# Patient Record
Sex: Female | Born: 1975 | Hispanic: Yes | Marital: Married | Smoking: Never smoker
Health system: Southern US, Community
[De-identification: ages and names within clinical notes are randomized; demographics above are authoritative.]

## PROBLEM LIST (undated history)

## (undated) DIAGNOSIS — M009 Pyogenic arthritis, unspecified: Secondary | ICD-10-CM

## (undated) DIAGNOSIS — M869 Osteomyelitis, unspecified: Secondary | ICD-10-CM

## (undated) DIAGNOSIS — O24419 Gestational diabetes mellitus in pregnancy, unspecified control: Secondary | ICD-10-CM

## (undated) HISTORY — DX: Osteomyelitis, unspecified: M86.9

## (undated) HISTORY — DX: Gestational diabetes mellitus in pregnancy, unspecified control: O24.419

## (undated) HISTORY — DX: Pyogenic arthritis, unspecified: M00.9

---
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
## 2012-05-10 DIAGNOSIS — M869 Osteomyelitis, unspecified: Secondary | ICD-10-CM

## 2012-05-10 HISTORY — DX: Osteomyelitis, unspecified: M86.9

## 2012-05-10 HISTORY — PX: OTHER SURGICAL HISTORY: SHX169

## 2013-05-10 DIAGNOSIS — O24419 Gestational diabetes mellitus in pregnancy, unspecified control: Secondary | ICD-10-CM

## 2013-05-10 HISTORY — PX: TUBAL LIGATION: SHX77

## 2013-05-10 HISTORY — DX: Gestational diabetes mellitus in pregnancy, unspecified control: O24.419

## 2016-05-20 ENCOUNTER — Ambulatory Visit (INDEPENDENT_AMBULATORY_CARE_PROVIDER_SITE_OTHER): Payer: Self-pay | Admitting: Internal Medicine

## 2016-05-20 ENCOUNTER — Encounter: Payer: Self-pay | Admitting: Internal Medicine

## 2016-05-20 VITALS — BP 112/80 | HR 70 | Resp 12 | Ht 59.5 in | Wt 141.0 lb

## 2016-05-20 DIAGNOSIS — F341 Dysthymic disorder: Secondary | ICD-10-CM

## 2016-05-20 DIAGNOSIS — R1033 Periumbilical pain: Secondary | ICD-10-CM

## 2016-05-20 DIAGNOSIS — G44219 Episodic tension-type headache, not intractable: Secondary | ICD-10-CM

## 2016-05-20 DIAGNOSIS — M542 Cervicalgia: Secondary | ICD-10-CM

## 2016-05-20 NOTE — Progress Notes (Signed)
   Subjective:    Patient ID: Angela Bennett, female    DOB: 08-21-1975, 41 y.o.   MRN: [id number]  HPI   Here to establish  1.  Headaches:  Has had headaches for 2 years. Has headaches 2-3 times per week.   Frontal headache above brow line.   Describes pounding headache.  Lasts 1-2 hours and does not take anything for the pain.   No aura. Describes mild photophobia and nausea, but no vomiting with the headaches. Has taken 200 mg Ibuprofen when the headache is bad, and helps a bit. No history of injury to head. Does feel like she is under a lot of stress in past 2 years--mainly related to [place]  Her sister died in [place] and left 3 children behind. Her mother, who is not well, is caring for them.  2.  Mid abdominal pain:  When has pressure over mid abdomen from clothes or something pushing against her, she has discomfort and makes it hard to breathe.  Does not wear tight clothing because of this. Has had this for many years. Has not had a physical in 3 years.  This pain predates that.  No outpatient prescriptions have been marked as taking for the 05/20/16 encounter (Office Visit) with Julieanne MansonElizabeth Corwyn Vora, MD.    No Known Allergies   Past Medical History:  Diagnosis Date  . Osteomyelitis of femur (HCC) 2014   [place]debrided   Past Surgical History:  Procedure Laterality Date  . debridment of femur Left 20014   [place]   Social History   Social History  . Marital status: Married    Spouse name: N/A  . Number of children: 4  . Years of education: N/A   Occupational History  . housewife currently    Social History Main Topics  . Smoking status: Never Smoker  . Smokeless tobacco: Never Used  . Alcohol use No  . Drug use: No  . Sexual activity: Not on file   Other Topics Concern  . Not on file   Social History Narrative   Originally from [place]   [place]   Lives at home with husband and 4 children   History reviewed. No pertinent family  history.    Review of Systems     Objective:   Physical Exam  NAD HEENT: PERRL, EOMI, discs sharp, TMs pearly gray, throat without injection. Neck:  Supple, No adenopathy or thyromegaly, tender over bilateral traps to paraspinous musculature to nuchal ridge. Chest:  CTA CV: RRR with normal S1 and S2, No S3, S4 or murmur, radial and DP pulses normal and equal Abd:  S, + BS, tender about mid abdomen, but no mass, rebound or peritoneal signs.  No HSM Neuro:  A & O x 3, CN II-XII grossly intact, DTRs 2+/4, Motor 5/5, sensory grossly normal. Gait and coordination normal        Assessment & Plan:  1.  Stress:  Contacted SW to notify feel patient with stressful issues that could benefit from counseling.  Has been depressed now she has more time on her hands to think.   2.  Headaches:  Suspect initiate as tension headaches and may become migranous.  To try ibuprofen 400-800 mg every 8 hours with food as needed for headache. PT to work on neck and upper back muscle pain and spasm.  3.  Abdominal Pain:  No findings:  Check CBC, CMP

## 2016-05-21 LAB — CBC WITH DIFFERENTIAL/PLATELET
BASOS ABS: 0 10*3/uL (ref 0.0–0.2)
Basos: 0 %
EOS (ABSOLUTE): 0.1 10*3/uL (ref 0.0–0.4)
Eos: 2 %
HEMATOCRIT: 37.9 % (ref 34.0–46.6)
HEMOGLOBIN: 13.2 g/dL (ref 11.1–15.9)
Immature Grans (Abs): 0 10*3/uL (ref 0.0–0.1)
Immature Granulocytes: 0 %
LYMPHS ABS: 2.4 10*3/uL (ref 0.7–3.1)
Lymphs: 37 %
MCH: 29.5 pg (ref 26.6–33.0)
MCHC: 34.8 g/dL (ref 31.5–35.7)
MCV: 85 fL (ref 79–97)
MONOCYTES: 8 %
MONOS ABS: 0.5 10*3/uL (ref 0.1–0.9)
NEUTROS ABS: 3.4 10*3/uL (ref 1.4–7.0)
Neutrophils: 53 %
Platelets: 210 10*3/uL (ref 150–379)
RBC: 4.47 x10E6/uL (ref 3.77–5.28)
RDW: 13.8 % (ref 12.3–15.4)
WBC: 6.5 10*3/uL (ref 3.4–10.8)

## 2016-05-21 LAB — COMPREHENSIVE METABOLIC PANEL
ALBUMIN: 4.2 g/dL (ref 3.5–5.5)
ALK PHOS: 60 IU/L (ref 39–117)
ALT: 12 IU/L (ref 0–32)
AST: 17 IU/L (ref 0–40)
Albumin/Globulin Ratio: 1.3 (ref 1.2–2.2)
BILIRUBIN TOTAL: 0.3 mg/dL (ref 0.0–1.2)
BUN / CREAT RATIO: 11 (ref 9–23)
BUN: 9 mg/dL (ref 6–20)
CHLORIDE: 100 mmol/L (ref 96–106)
CO2: 25 mmol/L (ref 18–29)
Calcium: 9.5 mg/dL (ref 8.7–10.2)
Creatinine, Ser: 0.82 mg/dL (ref 0.57–1.00)
GFR calc Af Amer: 104 mL/min/{1.73_m2} (ref >59)
GFR calc non Af Amer: 90 mL/min/{1.73_m2} (ref >59)
GLOBULIN, TOTAL: 3.2 g/dL (ref 1.5–4.5)
GLUCOSE: 88 mg/dL (ref 65–99)
Potassium: 4.5 mmol/L (ref 3.5–5.2)
SODIUM: 140 mmol/L (ref 134–144)
Total Protein: 7.4 g/dL (ref 6.0–8.5)

## 2016-06-02 ENCOUNTER — Telehealth: Payer: Self-pay | Admitting: Licensed Clinical Social Worker

## 2016-06-02 NOTE — Telephone Encounter (Signed)
Intern left message in order to schedule counseling.

## 2016-06-09 ENCOUNTER — Telehealth: Payer: Self-pay | Admitting: Licensed Clinical Social Worker

## 2016-06-09 NOTE — Telephone Encounter (Signed)
SWI left message for Angela Bennett in order to schedule a counseling session.

## 2016-06-11 ENCOUNTER — Telehealth: Payer: Self-pay | Admitting: Internal Medicine

## 2016-06-17 NOTE — Progress Notes (Signed)
Patient was notified of lab results and was inquiring about whether she needs have a pap done.

## 2016-06-17 NOTE — Telephone Encounter (Signed)
Patient says it has been three years since her last pap and would like to know if it is ok to make appointment for one.

## 2016-06-17 NOTE — Telephone Encounter (Signed)
Please schedule CPE with pap

## 2016-06-25 NOTE — Telephone Encounter (Signed)
Patient was scheduled for a CPE on 08/26/16 at 11:30 am.

## 2016-06-28 NOTE — Telephone Encounter (Signed)
noted 

## 2016-08-19 ENCOUNTER — Ambulatory Visit: Payer: Self-pay | Admitting: Internal Medicine

## 2016-08-26 ENCOUNTER — Ambulatory Visit (INDEPENDENT_AMBULATORY_CARE_PROVIDER_SITE_OTHER): Payer: Self-pay | Admitting: Internal Medicine

## 2016-08-26 VITALS — BP 110/70 | HR 72 | Resp 12 | Ht 60.0 in | Wt 138.0 lb

## 2016-08-26 DIAGNOSIS — Z0000 Encounter for general adult medical examination without abnormal findings: Secondary | ICD-10-CM

## 2016-08-26 DIAGNOSIS — Z124 Encounter for screening for malignant neoplasm of cervix: Secondary | ICD-10-CM

## 2016-08-26 DIAGNOSIS — K029 Dental caries, unspecified: Secondary | ICD-10-CM

## 2016-09-06 ENCOUNTER — Other Ambulatory Visit (INDEPENDENT_AMBULATORY_CARE_PROVIDER_SITE_OTHER): Payer: Self-pay | Admitting: Internal Medicine

## 2016-09-06 DIAGNOSIS — Z13220 Encounter for screening for lipoid disorders: Secondary | ICD-10-CM

## 2016-09-06 DIAGNOSIS — E010 Iodine-deficiency related diffuse (endemic) goiter: Secondary | ICD-10-CM
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
## 2016-09-07 LAB — LIPID PANEL W/O CHOL/HDL RATIO
Cholesterol, Total: 201 mg/dL — ABNORMAL HIGH (ref 100–199)
HDL: 41 mg/dL (ref >39)
LDL CALC: 126 mg/dL — AB (ref 0–99)
Triglycerides: 170 mg/dL — ABNORMAL HIGH (ref 0–149)
VLDL CHOLESTEROL CAL: 34 mg/dL (ref 5–40)

## 2016-09-07 LAB — TSH: TSH: 2.14 u[IU]/mL (ref 0.450–4.500)

## 2016-10-16 ENCOUNTER — Encounter: Payer: Self-pay | Admitting: Internal Medicine

## 2016-10-16 DIAGNOSIS — M009 Pyogenic arthritis, unspecified: Secondary | ICD-10-CM | POA: Insufficient documentation
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
## 2016-10-16 NOTE — Addendum Note (Signed)
Addended by: Marcene DuosMULBERRY, Jezebelle Ledwell M on: 10/16/2016 11:22 PM   Modules accepted: Orders

## 2017-01-31 ENCOUNTER — Other Ambulatory Visit (INDEPENDENT_AMBULATORY_CARE_PROVIDER_SITE_OTHER): Payer: Self-pay

## 2017-01-31 DIAGNOSIS — Z13220 Encounter for screening for lipoid disorders: Secondary | ICD-10-CM

## 2017-02-01 LAB — LIPID PANEL W/O CHOL/HDL RATIO
CHOLESTEROL TOTAL: 218 mg/dL — AB (ref 100–199)
HDL: 41 mg/dL (ref >39)
LDL CALC: 138 mg/dL — AB (ref 0–99)
TRIGLYCERIDES: 194 mg/dL — AB (ref 0–149)
VLDL CHOLESTEROL CAL: 39 mg/dL (ref 5–40)

## 2017-03-15 ENCOUNTER — Other Ambulatory Visit: Payer: Self-pay | Admitting: Licensed Clinical Social Worker

## 2017-08-25 ENCOUNTER — Other Ambulatory Visit: Payer: Self-pay

## 2017-08-25 DIAGNOSIS — E785 Hyperlipidemia, unspecified: Secondary | ICD-10-CM

## 2017-08-26 LAB — LIPID PANEL W/O CHOL/HDL RATIO
CHOLESTEROL TOTAL: 228 mg/dL — AB (ref 100–199)
HDL: 51 mg/dL (ref >39)
LDL Calculated: 148 mg/dL — ABNORMAL HIGH (ref 0–99)
TRIGLYCERIDES: 144 mg/dL (ref 0–149)
VLDL CHOLESTEROL CAL: 29 mg/dL (ref 5–40)

## 2017-10-05 ENCOUNTER — Encounter: Payer: Self-pay | Admitting: Internal Medicine

## 2017-10-05 ENCOUNTER — Ambulatory Visit: Payer: Self-pay | Admitting: Internal Medicine

## 2017-10-05 VITALS — BP 112/80 | HR 72 | Resp 12 | Ht 60.0 in | Wt 136.0 lb

## 2017-10-05 DIAGNOSIS — E782 Mixed hyperlipidemia: Secondary | ICD-10-CM

## 2017-10-05 NOTE — Patient Instructions (Signed)

## 2017-10-05 NOTE — Progress Notes (Signed)
   Subjective:    Patient ID: Angela Bennett, female    DOB: 11/11/1975, 42 y.o.   MRN: [id number]  HPI   Hyperlipidemia:  Has not been seen since April of 2018 when found to have hyperlipidemia with CPE. Has come in for fasting labs, however. Mildly overweight. Discussed concern regarding her total and LDL in particular are continuing to rise.  Lipid Panel     Component Value Date/Time   CHOL 228 (H) 08/25/2017 0841   TRIG 144 08/25/2017 0841   HDL 51 08/25/2017 0841   LDLCALC 148 (H) 08/25/2017 0841    Current Meds  Medication Sig  . Multiple Vitamin (MULTIVITAMIN) tablet Take 1 tablet by mouth daily.   No Known Allergies   Review of Systems     Objective:   Physical Exam NAD Lungs:  CTA CV:  RRR without murmur or rub.  Radial and DP pulses normal and equal Abd:  S, NT, No HSM or mass.. + Bs       Assessment & Plan:  1.  Hypercholesterolemia:  Discussed diet and physical activity at length.  Dietary recommendations given and to work on daily physical activity. Repeat fasting labs in 4 months with OV a couple of days later to discuss.

## 2018-01-15 DIAGNOSIS — E782 Mixed hyperlipidemia: Secondary | ICD-10-CM

## 2018-01-15 HISTORY — DX: Mixed hyperlipidemia: E78.2

## 2018-01-31 ENCOUNTER — Other Ambulatory Visit: Payer: Self-pay

## 2018-01-31 DIAGNOSIS — E782 Mixed hyperlipidemia: Secondary | ICD-10-CM

## 2018-01-31 DIAGNOSIS — Z0000 Encounter for general adult medical examination without abnormal findings: Secondary | ICD-10-CM

## 2018-02-01 LAB — COMPREHENSIVE METABOLIC PANEL
ALT: 9 IU/L (ref 0–32)
AST: 13 IU/L (ref 0–40)
Albumin/Globulin Ratio: 1.7 (ref 1.2–2.2)
Albumin: 4.4 g/dL (ref 3.5–5.5)
Alkaline Phosphatase: 59 IU/L (ref 39–117)
BILIRUBIN TOTAL: 0.3 mg/dL (ref 0.0–1.2)
BUN/Creatinine Ratio: 12 (ref 9–23)
BUN: 10 mg/dL (ref 6–24)
CALCIUM: 9 mg/dL (ref 8.7–10.2)
CHLORIDE: 105 mmol/L (ref 96–106)
CO2: 21 mmol/L (ref 20–29)
Creatinine, Ser: 0.82 mg/dL (ref 0.57–1.00)
GFR, EST AFRICAN AMERICAN: 103 mL/min/{1.73_m2} (ref >59)
GFR, EST NON AFRICAN AMERICAN: 89 mL/min/{1.73_m2} (ref >59)
GLUCOSE: 102 mg/dL — AB (ref 65–99)
Globulin, Total: 2.6 g/dL (ref 1.5–4.5)
POTASSIUM: 5.1 mmol/L (ref 3.5–5.2)
Sodium: 140 mmol/L (ref 134–144)
TOTAL PROTEIN: 7 g/dL (ref 6.0–8.5)

## 2018-02-01 LAB — CBC WITH DIFFERENTIAL/PLATELET
Basophils Absolute: 0 10*3/uL (ref 0.0–0.2)
Basos: 0 %
EOS (ABSOLUTE): 0.1 10*3/uL (ref 0.0–0.4)
Eos: 1 %
Hematocrit: 38.7 % (ref 34.0–46.6)
Hemoglobin: 13.1 g/dL (ref 11.1–15.9)
IMMATURE GRANS (ABS): 0 10*3/uL (ref 0.0–0.1)
IMMATURE GRANULOCYTES: 0 %
LYMPHS: 34 %
Lymphocytes Absolute: 1.7 10*3/uL (ref 0.7–3.1)
MCH: 29.9 pg (ref 26.6–33.0)
MCHC: 33.9 g/dL (ref 31.5–35.7)
MCV: 88 fL (ref 79–97)
MONOS ABS: 0.3 10*3/uL (ref 0.1–0.9)
Monocytes: 6 %
NEUTROS PCT: 59 %
Neutrophils Absolute: 2.8 10*3/uL (ref 1.4–7.0)
PLATELETS: 230 10*3/uL (ref 150–450)
RBC: 4.38 x10E6/uL (ref 3.77–5.28)
RDW: 13.8 % (ref 12.3–15.4)
WBC: 4.9 10*3/uL (ref 3.4–10.8)

## 2018-02-01 LAB — LIPID PANEL W/O CHOL/HDL RATIO
Cholesterol, Total: 199 mg/dL (ref 100–199)
HDL: 38 mg/dL — ABNORMAL LOW (ref >39)
LDL CALC: 135 mg/dL — AB (ref 0–99)
Triglycerides: 132 mg/dL (ref 0–149)
VLDL CHOLESTEROL CAL: 26 mg/dL (ref 5–40)

## 2018-02-03 ENCOUNTER — Encounter: Payer: Self-pay | Admitting: Internal Medicine

## 2018-02-23 ENCOUNTER — Ambulatory Visit: Payer: Self-pay | Admitting: Internal Medicine

## 2018-02-23 ENCOUNTER — Encounter: Payer: Self-pay | Admitting: Internal Medicine

## 2018-02-23 VITALS — BP 110/80 | HR 68 | Resp 12 | Ht 60.0 in | Wt 137.0 lb

## 2018-02-23 DIAGNOSIS — E663 Overweight: Secondary | ICD-10-CM

## 2018-02-23 DIAGNOSIS — M549 Dorsalgia, unspecified: Secondary | ICD-10-CM

## 2018-02-23 DIAGNOSIS — G8929 Other chronic pain: Secondary | ICD-10-CM

## 2018-02-23 DIAGNOSIS — R7301 Impaired fasting glucose: Secondary | ICD-10-CM

## 2018-02-23 DIAGNOSIS — M542 Cervicalgia: Secondary | ICD-10-CM

## 2018-02-23 DIAGNOSIS — E782 Mixed hyperlipidemia: Secondary | ICD-10-CM

## 2018-02-23 DIAGNOSIS — M5416 Radiculopathy, lumbar region: Secondary | ICD-10-CM

## 2018-02-23 MED ORDER — IBUPROFEN 200 MG PO TABS: ORAL_TABLET | ORAL | 0 refills | Status: DC

## 2018-02-23 NOTE — Patient Instructions (Addendum)
Free flu vaccine/covered flu vaccine (bring insurance card if you have one) at flu vaccine clinics:   [place] card sign up in October 17th for 2 hours  between 8:30 a.m. and 11:30 a.m .  [facility]:  [phone]  Oswaldo Done un vaso de agua antes de cada comida Tome un minimo de 6 a 8 vasos de agua diarios Coma tres veces al dia Coma Neomia Dear proteina y Neomia Dear grasa saludable con comida.  (huevos, pescado, pollo, pavo, y limite carnes rojas Coma 5 porciones diarias de legumbres.  Mezcle los colores Coma 2 porciones diarias de frutas con cascara cuando sea comestible Use platos pequeos Suelte su tenedor o cuchara despues de cada mordida hata que se mastique y se trague Come en la mesa con amigos o familiares por lo menos una vez al dia Apague la televisin y aparatos electrnicos durante la comida  Su objetivo debe ser perder una libra por semana  Estudios recientes indican que las personas quienes consumen todos de sus calorias durante 12 horas se bajan de pesocon Mas eficiencia.  Por ejemplo, si Usted come su primera comida a las 7:00 a.m., su comida final del dia se debe completar antes de las 7:00 p.m.

## 2018-02-23 NOTE — Progress Notes (Signed)
   Subjective:    Patient ID: Angela Bennett, female    DOB: [birth year], 42 y.o.   MRN: [id number]  HPI  visit in May with elevated cholesterol, she has been running a bit and abdominal exercises, but nothing for the last month. She has been drinking cactus containing drinks. Dropped off with caring for herself a month ago as her mother is in renal failure from DM and requiring money for dialysis.  Patient has been working more to provide the money for that care.   2.  Mild hyperglycemia:  History of gestational DM and mother with DM.    3.  Mildly overweight:  BMI just over 26.  As above.  4.  Entire bilateral back involved with pain, particularly low back. When has to stand a lot is when she has most of her pain.  When her 42 yo walks on her back, feels better. She can have mild sense of numbness and tingling in left buttock and lateral left thigh. Has taken Advil 2 tabs when has pain--takes once in a day and helps with the pain.  No outpatient medications have been marked as taking for the 02/23/18 encounter (Office Visit) with Julieanne Manson, MD.   No Known Allergies  Review of Systems     Objective:   Physical Exam NAD Neck/ MS:  Tender over trap as cervical, lumbosacral paraspinous musculature bilaterally.  Tender mildly over lumbar spinous processes. Negative straight leg raise. Chest:  CTA CV:  RRR without murmur or rub. LE:  No edema Neuro:  DTRs 2+/4 and Motor 5/5 throughout.  Sensory to light touch normal.  Gait normal       Assessment & Plan:  1.  Hyperlipidemia:  Concerned she is not performing self care.  Expressed need to get started on lifestyle changes for life to prevent illness as her mother has developed with DM.   Discussed her cholesterol profile and mild hyperglycemia and gestational DM history all concerns me for development of DM in herself if no changes made long term. Goal to work on diet and physical activity to lose 1/2 lb per week.  2.   Fasting hyperglycemia:  Mild.  As above.  3.  Mildly overweight:  As above.  4.  Back and neck pain with left lumbar radiculopathy:  PT referral  Advil 2 tabs twice daily with food for 2 weeks, then only as needed.

## 2018-04-07 ENCOUNTER — Encounter (HOSPITAL_COMMUNITY): Payer: Self-pay | Admitting: Emergency Medicine

## 2018-04-07 ENCOUNTER — Other Ambulatory Visit: Payer: Self-pay

## 2018-04-07 ENCOUNTER — Emergency Department (HOSPITAL_COMMUNITY)
Admission: EM | Admit: 2018-04-07 | Discharge: 2018-04-07 | Disposition: A | Discharge status: Home / Self Care | Payer: No Typology Code available for payment source | Attending: Emergency Medicine | Admitting: Emergency Medicine

## 2018-04-07 ENCOUNTER — Emergency Department (HOSPITAL_COMMUNITY): Payer: No Typology Code available for payment source

## 2018-04-07 DIAGNOSIS — Y939 Activity, unspecified: Secondary | ICD-10-CM | POA: Insufficient documentation

## 2018-04-07 DIAGNOSIS — M545 Low back pain: Secondary | ICD-10-CM | POA: Insufficient documentation

## 2018-04-07 DIAGNOSIS — Y929 Unspecified place or not applicable: Secondary | ICD-10-CM | POA: Diagnosis not present

## 2018-04-07 DIAGNOSIS — M542 Cervicalgia: Secondary | ICD-10-CM | POA: Diagnosis not present

## 2018-04-07 DIAGNOSIS — Y999 Unspecified external cause status: Secondary | ICD-10-CM | POA: Insufficient documentation

## 2018-04-07 DIAGNOSIS — M79641 Pain in right hand: Secondary | ICD-10-CM | POA: Diagnosis not present

## 2018-04-07 MED ORDER — METHOCARBAMOL 500 MG PO TABS: 500.0000 mg | ORAL_TABLET | Freq: Two times a day (BID) | ORAL | 0 refills | Status: DC
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
## 2018-04-07 NOTE — ED Notes (Signed)
Pt transported to XR.  

## 2018-04-07 NOTE — Discharge Instructions (Addendum)
Please read attached information. If you experience any new or worsening signs or symptoms please return to the emergency room for evaluation. Please follow-up with your primary care provider or specialist as discussed. Please use medication prescribed only as directed and discontinue taking if you have any concerning signs or symptoms.   °
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
## 2018-04-07 NOTE — ED Notes (Signed)
Patient verbalizes understanding of discharge instructions. Opportunity for questioning and answers were provided. Armband removed by staff, pt discharged from ED. Instructions given for thumb spica application. Pt ambulatory to lobby with family.

## 2018-06-01 ENCOUNTER — Ambulatory Visit: Payer: Self-pay | Admitting: Internal Medicine

## 2020-11-18 ENCOUNTER — Emergency Department (HOSPITAL_COMMUNITY)
Admission: EM | Admit: 2020-11-18 | Discharge: 2020-11-19 | Disposition: A | Discharge status: Home / Self Care | Payer: Self-pay | Attending: Emergency Medicine | Admitting: Emergency Medicine

## 2020-11-18 ENCOUNTER — Other Ambulatory Visit: Payer: Self-pay

## 2020-11-18 DIAGNOSIS — R1030 Lower abdominal pain, unspecified: Secondary | ICD-10-CM | POA: Insufficient documentation

## 2020-11-18 DIAGNOSIS — R109 Unspecified abdominal pain: Secondary | ICD-10-CM

## 2020-11-18 DIAGNOSIS — E871 Hypo-osmolality and hyponatremia: Secondary | ICD-10-CM | POA: Insufficient documentation

## 2020-11-18 DIAGNOSIS — Z20822 Contact with and (suspected) exposure to covid-19: Secondary | ICD-10-CM | POA: Insufficient documentation

## 2020-11-18 DIAGNOSIS — R197 Diarrhea, unspecified: Secondary | ICD-10-CM | POA: Insufficient documentation

## 2020-11-18 DIAGNOSIS — R7401 Elevation of levels of liver transaminase levels: Secondary | ICD-10-CM

## 2020-11-18 NOTE — ED Triage Notes (Signed)
Patient with two to three day history of abdominal pain and bloating.  She states that she felt better on Monday and then today it got worse.  She states she has nausea, no vomiting or diarrhea.  She states that she has had normal BMs with no urinary problems.

## 2020-11-19 ENCOUNTER — Emergency Department (HOSPITAL_COMMUNITY): Payer: Self-pay

## 2020-11-19 ENCOUNTER — Encounter (HOSPITAL_COMMUNITY): Payer: Self-pay | Admitting: Emergency Medicine

## 2020-11-19 LAB — COMPREHENSIVE METABOLIC PANEL
ALT: 65 U/L — ABNORMAL HIGH (ref 0–44)
AST: 49 U/L — ABNORMAL HIGH (ref 15–41)
Albumin: 3.8 g/dL (ref 3.5–5.0)
Alkaline Phosphatase: 45 U/L (ref 38–126)
Anion gap: 7 (ref 5–15)
BUN: 8 mg/dL (ref 6–20)
CO2: 22 mmol/L (ref 22–32)
Calcium: 8.3 mg/dL — ABNORMAL LOW (ref 8.9–10.3)
Chloride: 104 mmol/L (ref 98–111)
Creatinine, Ser: 0.63 mg/dL (ref 0.44–1.00)
GFR, Estimated: >60 mL/min (ref >60)
Glucose, Bld: 99 mg/dL (ref 70–99)
Potassium: 3.7 mmol/L (ref 3.5–5.1)
Sodium: 133 mmol/L — ABNORMAL LOW (ref 135–145)
Total Bilirubin: 0.6 mg/dL (ref 0.3–1.2)
Total Protein: 6.6 g/dL (ref 6.5–8.1)

## 2020-11-19 LAB — I-STAT BETA HCG BLOOD, ED (MC, WL, AP ONLY): I-stat hCG, quantitative: <5 m[IU]/mL (ref <5)

## 2020-11-19 LAB — RESP PANEL BY RT-PCR (FLU A&B, COVID) ARPGX2
Influenza A by PCR: NEGATIVE
Influenza B by PCR: NEGATIVE
SARS Coronavirus 2 by RT PCR: NEGATIVE

## 2020-11-19 LAB — CBC
HCT: 37.2 % (ref 36.0–46.0)
Hemoglobin: 12.7 g/dL (ref 12.0–15.0)
MCH: 30.2 pg (ref 26.0–34.0)
MCHC: 34.1 g/dL (ref 30.0–36.0)
MCV: 88.4 fL (ref 80.0–100.0)
Platelets: 205 10*3/uL (ref 150–400)
RBC: 4.21 MIL/uL (ref 3.87–5.11)
RDW: 12.8 % (ref 11.5–15.5)
WBC: 4.4 10*3/uL (ref 4.0–10.5)
nRBC: 0 % (ref 0.0–0.2)

## 2020-11-19 LAB — URINALYSIS, ROUTINE W REFLEX MICROSCOPIC
Bacteria, UA: NONE SEEN
Bilirubin Urine: NEGATIVE
Glucose, UA: NEGATIVE mg/dL
Ketones, ur: NEGATIVE mg/dL
Nitrite: NEGATIVE
Protein, ur: NEGATIVE mg/dL
Specific Gravity, Urine: 1.013 (ref 1.005–1.030)
pH: 6 (ref 5.0–8.0)

## 2020-11-19 LAB — LIPASE, BLOOD: Lipase: 29 U/L (ref 11–51)

## 2020-11-19 NOTE — ED Provider Notes (Signed)
[facility] EMERGENCY DEPARTMENT Provider Note   CSN: [id number] Arrival date & time: 11/18/20  2115     History Chief Complaint  Patient presents with   Abdominal Pain    Angela Bennett is a 45 y.o. female.  The history is provided by the patient. A language interpreter was used.  Abdominal Pain She has history of gestational diabetes, hyperlipidemia and comes in because of abdominal pain which started yesterday.  Pain is across the lower abdomen without radiation.  She denies nausea, vomiting, diarrhea.  However, she did have nausea, vomiting, diarrhea starting 3 days ago and ending 2 days ago.  She denies fever, chills, sweats.  Nothing makes her pain better, nothing makes it worse.  She states that it has actually improved since she has been in the emergency department.  She has not had pain like this before.  She treated herself with aspirin which did give some temporary relief.  She denies urinary urgency, frequency, tenesmus, dysuria.   Past Medical History:  Diagnosis Date   Gestational diabetes 2015   Osteomyelitis of femur (HCC) 2014   [place]--debrided   Septic arthritis of knee, left (HCC)    [place]    Patient Active Problem List   Diagnosis Date Noted   Mixed hyperlipidemia 01/15/2018   Septic arthritis of knee, left (HCC)    Gestational diabetes 05/10/2013    Past Surgical History:  Procedure Laterality Date   debridment of femur Left 2014   [place] x2 and  x2 in [place] prior.  Not clear if septic left knee of osteomyelitis of femur   TUBAL LIGATION  2015     OB History   No obstetric history on file.     Family History  Problem Relation Age of Onset   Diabetes Mother    Kidney disease Mother        CKD 2019 on dialysis   Congenital heart disease Brother     Social History   Tobacco Use   Smoking status: Never   Smokeless tobacco: Never  Vaping Use   Vaping Use: Never used  Substance Use Topics   Alcohol use: No   Drug use: No     Home Medications Prior to Admission medications   Medication Sig Start Date End Date Taking? Authorizing Provider  ibuprofen (ADVIL) 200 MG tablet 2 tabs by mouth twice daily with meals for next 2 weeks, then only as needed. 02/23/18   Julieanne Manson, MD  methocarbamol (ROBAXIN) 500 MG tablet Take 1 tablet (500 mg total) by mouth 2 (two) times daily. 04/07/18   Hedges, Tinnie Gens, PA-C  Multiple Vitamin (MULTIVITAMIN) tablet Take 1 tablet by mouth daily.    [provider]    Allergies    Patient has no known allergies.  Review of Systems   Review of Systems  Gastrointestinal:  Positive for abdominal pain.  All other systems reviewed and are negative.  Physical Exam Updated Vital Signs BP 106/63   Pulse (!) 55   Temp 98 F (36.7 C) (Oral)   Resp 17   SpO2 100%   Physical Exam Vitals and nursing note reviewed.  45 year old female, resting comfortably and in no acute distress. Vital signs are significant for borderline slow heart rate. Oxygen saturation is 100%, which is normal. Head is normocephalic and atraumatic. PERRLA, EOMI. Oropharynx is clear. Neck is nontender and supple without adenopathy or JVD. Back is nontender in the midline.  There is mild left CVA tenderness.  Lungs are clear without rales, wheezes, or rhonchi. Chest is nontender. Heart has regular rate and rhythm without murmur. Abdomen is soft, flat, with mild tenderness across the lower abdomen with maximum tenderness in the left lower quadrant.  There is no rebound or guarding.  There are no masses or hepatosplenomegaly and peristalsis is hypoactive. Extremities have no cyanosis or edema, full range of motion is present. Skin is warm and dry without rash. Neurologic: Mental status is normal, cranial nerves are intact, there are no motor or sensory deficits.  ED Results / Procedures / Treatments   Labs (all labs ordered are listed, but only abnormal results are displayed) Labs Reviewed   COMPREHENSIVE METABOLIC PANEL - Abnormal; Notable for the following components:      Result Value   Sodium 133 (*)    Calcium 8.3 (*)    AST 49 (*)    ALT 65 (*)    All other components within normal limits  URINALYSIS, ROUTINE W REFLEX MICROSCOPIC - Abnormal; Notable for the following components:   APPearance HAZY (*)    Hgb urine dipstick SMALL (*)    Leukocytes,Ua TRACE (*)    All other components within normal limits  RESP PANEL BY RT-PCR (FLU A&B, COVID) ARPGX2  LIPASE, BLOOD  CBC  I-STAT BETA HCG BLOOD, ED ([facility], [facility], [facility] ONLY)   Radiology CT Renal Stone Study  Result Date: 11/19/2020 CLINICAL DATA:  45 year old female with flank and left lower quadrant pain. Nausea. EXAM: CT ABDOMEN AND PELVIS WITHOUT CONTRAST TECHNIQUE: Multidetector CT imaging of the abdomen and pelvis was performed following the standard protocol without IV contrast. COMPARISON:  None. FINDINGS: Lower chest: Mild elevation of the right hemidiaphragm, likely normal variant. Minor atelectasis at the right lung base. Hepatobiliary: Faint layering sludge or gallstones on coronal image 47, but no pericholecystic inflammation. The liver is remarkable for numerous small and circumscribed low-density areas scattered throughout the right lobe. The largest have simple fluid density, including the 2.8 cm lesion in the right lobe on series 3, image 22. These are most compatible with benign cysts. Pancreas: Negative. Spleen: Negative.  Incidental splenule, normal variant. Adrenals/Urinary Tract: Normal adrenal glands. No nephrolithiasis, hydronephrosis, or perinephric stranding. Negative noncontrast right kidney and right ureter. Noncontrast left kidney and left ureter likewise appear negative. Unremarkable bladder. No urinary calculus identified. Stomach/Bowel: Small volume hyperdense retained stool in the rectum and mildly redundant sigmoid. Mild retained stool from the right colon to the descending colon. No large bowel  inflammation, with diminutive, normal appendix suspected on coronal image 57. Decompressed and negative terminal ileum. No dilated small bowel. Largely decompressed stomach and duodenum. No free air or free fluid. No mesenteric inflammation. Vascular/Lymphatic: Normal caliber abdominal aorta. Minimal calcified atherosclerosis. No lymphadenopathy. Reproductive: Negative noncontrast appearance. Other: No pelvic free fluid. Musculoskeletal: Negative. IMPRESSION: 1. No urinary calculus or obstructive uropathy. 2. Faint layering sludge or gallstones in the gallbladder, but no CT evidence of acute cholecystitis. 3. No acute or inflammatory process identified in the non-contrast abdomen or pelvis. Multiple benign hepatic cysts. Electronically Signed   By: Odessa Fleming M.D.   On: 11/19/2020 07:14    Procedures Procedures   Medications Ordered in ED Medications - No data to display  ED Course  I have reviewed the triage vital signs and the nursing notes.  Pertinent labs & imaging results that were available during my care of the patient were reviewed by me and considered in my medical decision making (see chart for details).  MDM Rules/Calculators/A&P                         Abdominal pain of uncertain etiology.  Consider urinary tract infection, urolithiasis, diverticulitis, gastroenteritis.  Old records are reviewed, and she has no relevant past visits.  Initial labs show mild hyponatremia and mild elevation of transaminases, none of which are felt to be clinically significant.  WBC is normal.  Urinalysis is normal.  She will be sent for renal stone protocol CT scan.  CT shows some gallbladder sludge but is otherwise unremarkable.  Patient has no right upper quadrant tenderness, doubt biliary colic.  At this point, I suspect that her pain was residual from her vomiting and diarrhea which I believe is a viral gastroenteritis.  She is discharged with instructions to continue using over-the-counter analgesics as  needed, return if symptoms worsen.  Final Clinical Impression(s) / ED Diagnoses Final diagnoses:  Abdominal pain, unspecified abdominal location  Elevated transaminase level  Hyponatremia    Rx / DC Orders ED Discharge Orders     None        Dione Booze, MD 11/19/20 [phone]

## 2020-11-19 NOTE — ED Provider Notes (Signed)
Emergency Medicine Provider Triage Evaluation Note  Daisia Slomski , a 45 y.o. female  was evaluated in triage.  Pt complains of generalized abdominal pain since Saturday.  Reports associated diarrhea. Denies vomiting.  Denies any dysuria.  Feels bloated.  Denies successful treatments PTA.Marland Kitchen  Review of Systems  Positive: Abdominal pain, diarrhea Negative: Fever, dysuria  Physical Exam  BP 107/63 (BP Location: Right Arm)   Pulse (!) 56   Temp 98 F (36.7 C) (Oral)   Resp 16   SpO2 100%  Gen:   Awake, no distress   Resp:  Normal effort  MSK:   Moves extremities without difficulty  Other:  No focal abdominal tenderness, no RLQ tenderness or pain at McBurney's point, no RUQ tenderness or Murphy's sign, no left-sided abdominal tenderness, no fluid wave, or signs of peritonitis   Medical Decision Making  Medically screening exam initiated at 2:07 AM.  Appropriate orders placed.  [name] was informed that the remainder of the evaluation will be completed by another provider, this initial triage assessment does not replace that evaluation, and the importance of remaining in the ED until their evaluation is complete.     Roxy Horseman, PA-C 11/19/20 [id number]    Dione Booze, MD 11/19/20 [phone]

## 2020-11-19 NOTE — Discharge Instructions (Addendum)
Puede tomar ibuprofeno o acetaminfeno segn sea necesario para el dolor.  Si la diarrea regresa, tome loperamida (Imodium AD).  Regrese si su dolor est empeorando.

## 2022-06-29 ENCOUNTER — Other Ambulatory Visit: Payer: Self-pay

## 2022-06-29 ENCOUNTER — Ambulatory Visit (INDEPENDENT_AMBULATORY_CARE_PROVIDER_SITE_OTHER): Payer: Self-pay

## 2022-06-29 ENCOUNTER — Ambulatory Visit (HOSPITAL_COMMUNITY)
Admission: EM | Admit: 2022-06-29 | Discharge: 2022-06-29 | Disposition: A | Discharge status: Home / Self Care | Payer: Self-pay | Attending: Physician Assistant | Admitting: Physician Assistant

## 2022-06-29 ENCOUNTER — Encounter (HOSPITAL_COMMUNITY): Payer: Self-pay

## 2022-06-29 DIAGNOSIS — M5030 Other cervical disc degeneration, unspecified cervical region: Secondary | ICD-10-CM

## 2022-06-29 DIAGNOSIS — M5412 Radiculopathy, cervical region: Secondary | ICD-10-CM

## 2022-06-29 DIAGNOSIS — R001 Bradycardia, unspecified: Secondary | ICD-10-CM

## 2022-06-29 MED ORDER — TIZANIDINE HCL 4 MG PO CAPS: 4.0000 mg | ORAL_CAPSULE | Freq: Three times a day (TID) | ORAL | 0 refills | Status: DC | PRN

## 2022-06-29 MED ORDER — NAPROXEN 375 MG PO TABS: 375.0000 mg | ORAL_TABLET | Freq: Two times a day (BID) | ORAL | 0 refills | Status: DC
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
## 2022-06-29 NOTE — Discharge Instructions (Signed)
Su radiografa mostr artritis que probablemente est causando presin [employer] nervios que causan su dolor. Tome Naprosyn [employer]. No tome AINE con este medicamento, incluidos aspirina, ibuprofeno/Advil, naproxeno/Aleve. Tome Zanaflex hasta 3 veces al da. Esto le produce sueo, as que no conduzca ni beba alcohol mientras lo toma. Utilice calor y estiramiento suave. Le recomiendo que haga un seguimiento con un proveedor de Camera operator. Llmelos para programar una cita. Si tiene Curator, entumecimiento/hormigueo en el brazo o debilidad, debe ser atendido de inmediato.  Su electrocardiograma fue normal pero mostr que su frecuencia cardaca es un poco lenta. Me gustara que hiciera seguimiento con un cardilogo; Llame para hacer una cita. Si tiene [employer] o siente que se va a Insurance account manager, vaya a la sala de emergencias.

## 2022-06-29 NOTE — ED Triage Notes (Addendum)
Patient c/o headache and left arm pain  x 1 week. Pain hurts when using her left arm.Patient states she had left chest pain 2 hours ago and then it went away. Patient added that she had a syncopal episode 2 weeks ago at church. Patient states she had fever, body aches, and diarrhea 10 days ago. Patient denies any SOB or nausea today  Patient states she has taken Ibuprofen a week ago.

## 2022-07-16 ENCOUNTER — Ambulatory Visit: Payer: Self-pay | Attending: Cardiovascular Disease | Admitting: Cardiovascular Disease

## 2022-07-16 ENCOUNTER — Encounter: Payer: Self-pay | Admitting: Cardiovascular Disease

## 2022-07-16 VITALS — BP 104/67 | HR 59 | Ht 60.0 in | Wt 140.0 lb

## 2022-07-16 DIAGNOSIS — R001 Bradycardia, unspecified: Secondary | ICD-10-CM

## 2022-07-16 DIAGNOSIS — Z8632 Personal history of gestational diabetes: Secondary | ICD-10-CM

## 2022-07-16 DIAGNOSIS — E785 Hyperlipidemia, unspecified: Secondary | ICD-10-CM

## 2022-07-16 NOTE — Patient Instructions (Signed)
Medication Instructions:  No changes *If you need a refill on your cardiac medications before your next appointment, please call your pharmacy*  Follow-Up: At [facility], you and your health needs are our priority.  As part of our continuing mission to provide you with exceptional heart care, we have created designated Provider Care Teams.  These Care Teams include your primary Cardiologist (physician) and Advanced Practice Providers (APPs -  Physician Assistants and Nurse Practitioners) who all work together to provide you with the care you need, when you need it.  We recommend signing up for the patient portal called "MyChart".  Sign up information is provided on this After Visit Summary.  MyChart is used to connect with patients for Virtual Visits (Telemedicine).  Patients are able to view lab/test results, encounter notes, upcoming appointments, etc.  Non-urgent messages can be sent to your provider as well.   To learn more about what you can do with MyChart, go to [email or web address].    Your next appointment:    Follow up as needed  Provider:   Dr Sallyanne Kuster

## 2022-07-17 ENCOUNTER — Encounter: Payer: Self-pay | Admitting: Cardiovascular Disease

## 2022-07-17 NOTE — Progress Notes (Signed)
Cardiology Office Note:    Date:  07/17/2022   ID:  [place] Callas, DOB 1975-08-16, MRN [id number]  PCP:  Mack Hook, [place] Providers Cardiologist:  None     Referring MD: Mack Hook, MD   Chief Complaint  Patient presents with   Consult  Angela Bennett is a 47 y.o. female who is being seen today for the evaluation of chest pain and bradycardia at the request of Mack Hook, MD.   History of Present Illness:    Angela Bennett is a 47 y.o. female with a hx of generally good health other than an episode of left knee infection about 10 years ago.  She does not have chronic illnesses such as hypertension, diabetes, kidney problems, known heart disease or peripheral vascular disease.  She did have gestational diabetes and her lipid profile from 2022 shows a fairly low HDL cholesterol (although it was normal in 2019). She is premenopausal.  She does not smoke.    She was seen in the emergency room 06/29/2022 with complaints of headaches and left shoulder and arm pain.  She has had some problems with chest discomfort as well.  She describes the chest pain as a sharp brief discomfort in the anterior chest that occurs when she is emotionally upset, but does not occur when she is doing physical labor.  She works hard, Education administrator houses.  She never has chest pain during activity.  She does not have exertional dyspnea.  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  She was referred to sports medicine.  ECG performed in the emergency room showed sinus bradycardia with a heart rate of 51 bpm but was otherwise normal.  She reports 1 episode of lightheadedness that did not lead to full-blown syncope that occurred 2 weeks before the emergency room visit and sounds more like orthostatic dizziness.  She has never had syncope.  She is not  aware of any palpitations.  Family history is significant for the fact that her mother has diabetes mellitus and is on hemodialysis and that she has a brother that had poorly defined congenital heart disease.  She is originally from [place].  Her daughter served as Optometrist.  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  Not clear if septic left knee of osteomyelitis of femur   TUBAL LIGATION  2015    Current Medications: No outpatient medications have been marked as taking for the 07/16/22 encounter (Office Visit) with Sanda Klein, MD.     Allergies:   Patient has no known allergies.   Social History   Socioeconomic History   Marital status: Married    Spouse name: Not on file   Number of children: 4   Years of education: Not on file   Highest education level: Not on file  Occupational History   Occupation: housewife currently  Tobacco Use   Smoking status: Never   Smokeless tobacco: Never  Vaping Use   Vaping Use: Never used  Substance and Sexual Activity   Alcohol use: No   Drug use: No  Sexual activity: Not on file  Other Topics Concern   Not on file  Social History Narrative   Originally from [place]   Came to [place]. 2003   Lives at home with husband and 4 children   Social Determinants of Health   Financial Resource Strain: Not on file  Food Insecurity: Not on file  Transportation Needs: Not on file  Physical Activity: Not on file  Stress: Not on file  Social Connections: Not on file     Family History: The patient's family history includes Congenital heart disease in her brother; Diabetes in her mother; Kidney disease in her mother.  ROS:   Please see the history of present illness.     All other systems  reviewed and are negative.  EKGs/Labs/Other Studies Reviewed:    The following studies were reviewed today: Reviewed ECG, notes, labs from ER visit.  EKG:  EKG is  ordered today.  The ekg ordered today demonstrates borderline sinus bradycardia with a heart rate of 59 bpm, otherwise a completely normal tracing.  QT 402 ms.  Recent Labs: No results found for requested labs within last 365 days.  Recent Lipid Panel    Component Value Date/Time   CHOL 199 01/31/2018 0850   TRIG 132 01/31/2018 0850   HDL 38 (L) 01/31/2018 0850   LDLCALC 135 (H) 01/31/2018 0850     Risk Assessment/Calculations:                Physical Exam:    VS:  BP 104/67 (BP Location: Left Arm, Patient Position: Sitting, Cuff Size: Normal)   Pulse (!) 59   Ht 5' (1.524 m)   Wt 140 lb (63.5 kg)   LMP 06/06/2022   SpO2 99%   BMI 27.34 kg/m     Wt Readings from Last 3 Encounters:  07/16/22 140 lb (63.5 kg)  04/07/18 138 lb (62.6 kg)  02/23/18 137 lb (62.1 kg)     GEN:  Well nourished, well developed in no acute distress HEENT: Normal NECK: No JVD; No carotid bruits LYMPHATICS: No lymphadenopathy CARDIAC: RRR, no murmurs, rubs, gallops RESPIRATORY:  Clear to auscultation without rales, wheezing or rhonchi  ABDOMEN: Soft, non-tender, non-distended MUSCULOSKELETAL:  No edema; No deformity  SKIN: Warm and dry NEUROLOGIC:  Alert and oriented x 3 PSYCHIATRIC:  Normal affect   ASSESSMENT:    1. Bradycardia   2. Dyslipidemia   3. History of gestational diabetes    PLAN:    In order of problems listed above:  Sinus bradycardia: Very mild, likely constitutional.  She has never had syncope and has good exercise tolerance.  She does not have any signs or symptoms of hypothyroidism otherwise.  I do not think further evaluation is necessary at this time.  Her daughter has a smart watch and can periodically check her mother's heart rhythm if she has any complaints of dizziness or weakness.  She can send  me those tracings to MyChart. Dyslipidemia: Mildly decreased HDL cholesterol at 38 on labs obtained in 2022 although her HDL was 51 in 2019.  Personal history of gestational diabetes and family history of diabetes mellitus.  Important to maintain a healthy weight, avoid excessive sugar and starchy foods in her diet.  Remain physically active.           Medication Adjustments/Labs and Tests Ordered: Current medicines are reviewed at length with the patient today.  Concerns regarding medicines are outlined above.  Orders Placed This Encounter  Procedures  EKG 12-Lead   No orders of the defined types were placed in this encounter.   Patient Instructions  Medication Instructions:  No changes *If you need a refill on your cardiac medications before your next appointment, please call your pharmacy*  Follow-Up: At [facility], you and your health needs are our priority.  As part of our continuing mission to provide you with exceptional heart care, we have created designated Provider Care Teams.  These Care Teams include your primary Cardiologist (physician) and Advanced Practice Providers (APPs -  Physician Assistants and Nurse Practitioners) who all work together to provide you with the care you need, when you need it.  We recommend signing up for the patient portal called "MyChart".  Sign up information is provided on this After Visit Summary.  MyChart is used to connect with patients for Virtual Visits (Telemedicine).  Patients are able to view lab/test results, encounter notes, upcoming appointments, etc.  Non-urgent messages can be sent to your provider as well.   To learn more about what you can do with MyChart, go to [email or web address].    Your next appointment:    Follow up as needed  Provider:   Dr Sallyanne Kuster     Signed, Sanda Klein, MD  07/17/2022 11:49 AM    [place]

## 2022-07-20 ENCOUNTER — Telehealth: Payer: Self-pay | Admitting: Licensed Clinical Social Worker

## 2022-07-20 IMAGING — CT CT RENAL STONE PROTOCOL
2 of 4 series · 15 of 46 positions shown, 17 images · non-contrast
Comparison: None.

CLINICAL DATA: 44-year-old female with flank and left lower
quadrant pain. Nausea.

EXAM:
CT ABDOMEN AND PELVIS WITHOUT CONTRAST
TECHNIQUE: Multidetector CT imaging of the abdomen and pelvis was performed
following the standard protocol without IV contrast.

[Series 3: stone study 5.0 i30f 2 · axial · 0.68mm/px · z∈[+811,+1231]mm · 12 of 94 slices shown, 14 images]
[im 5/94  soft-tissue]
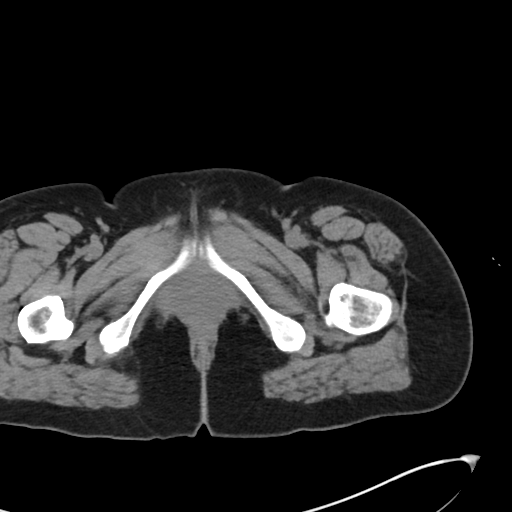
[im 5/94  bone]
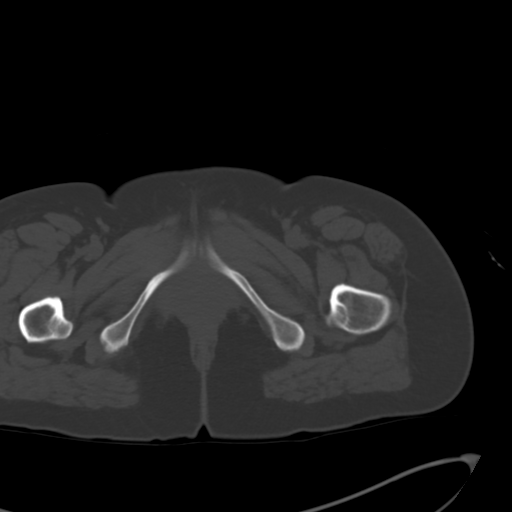
[im 13/94  soft-tissue]
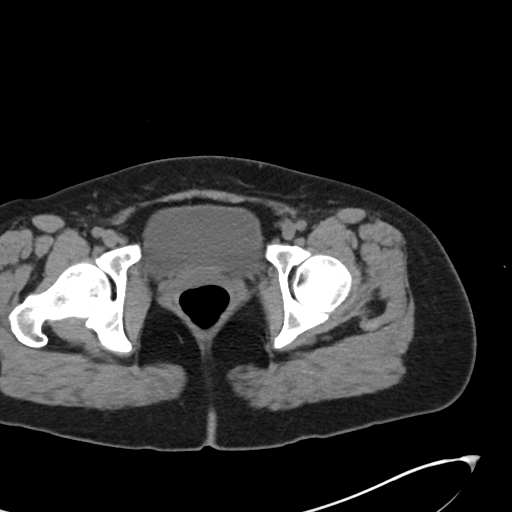
[im 21/94  soft-tissue]
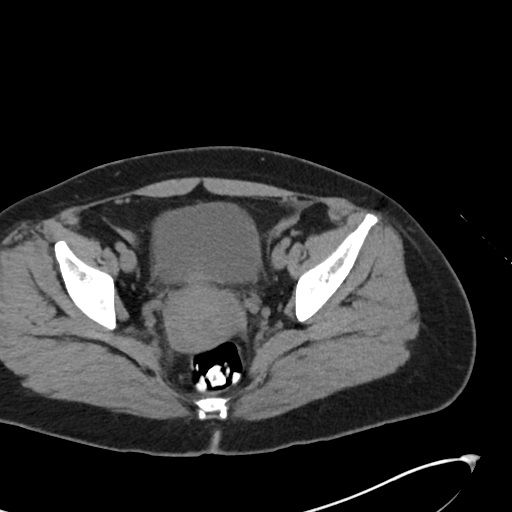
[im 29/94  soft-tissue]
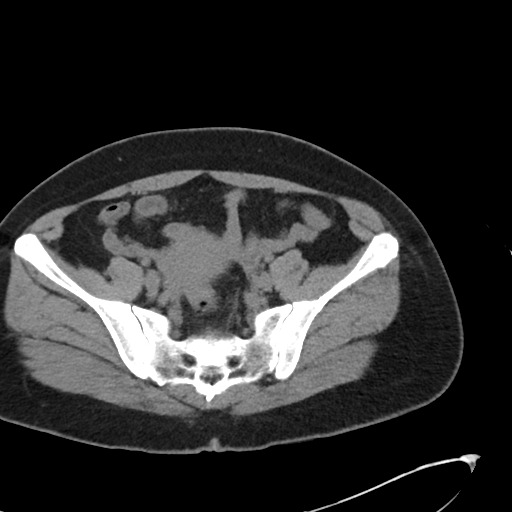
[im 37/94  soft-tissue]
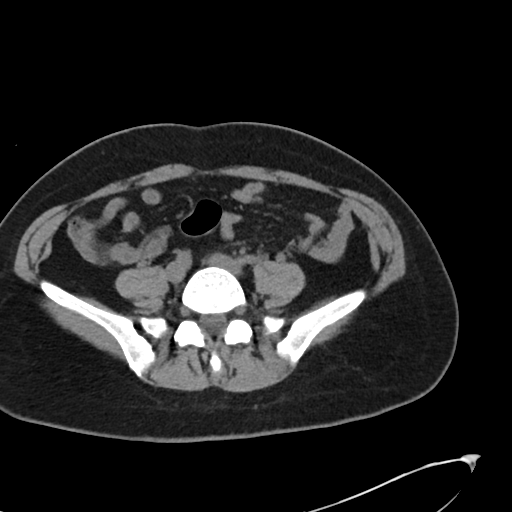
[im 45/94  soft-tissue]
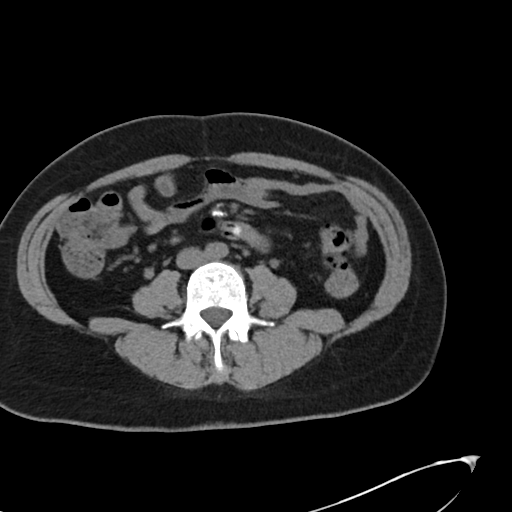
[im 49/94  soft-tissue]
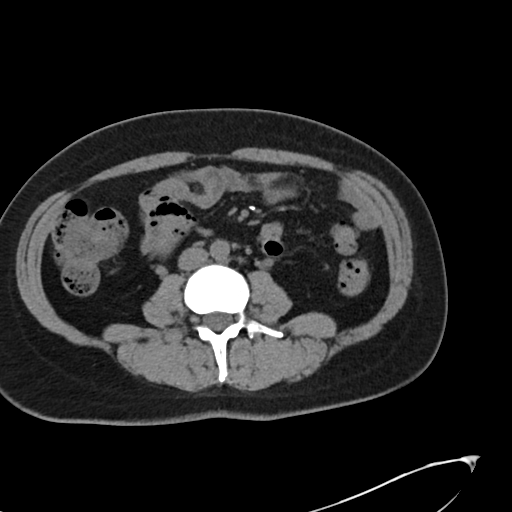
[im 57/94  soft-tissue]
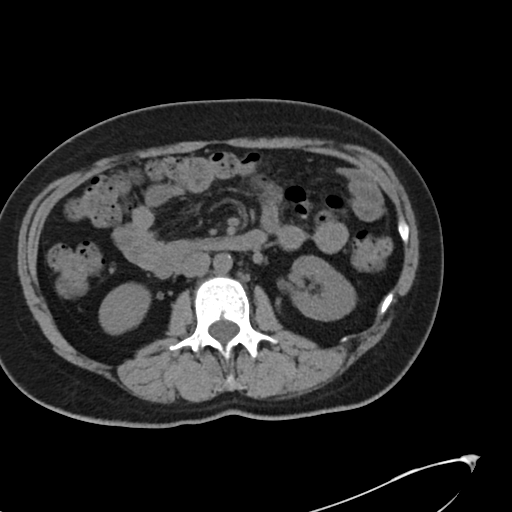
[im 65/94  soft-tissue]
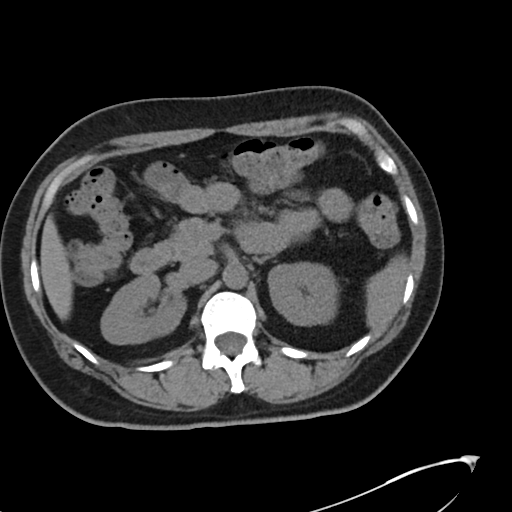
[im 65/94  bone]
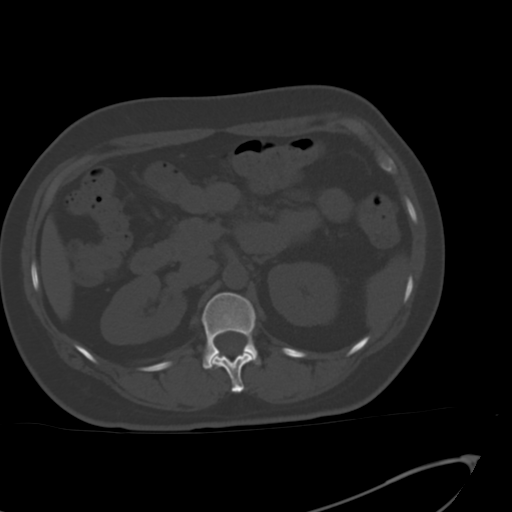
[im 73/94  soft-tissue]
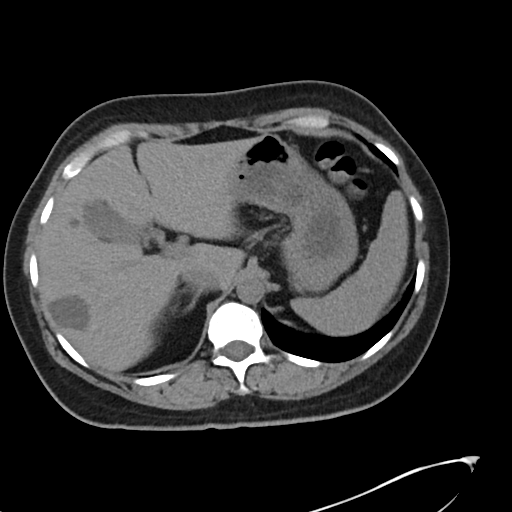
[im 81/94  soft-tissue]
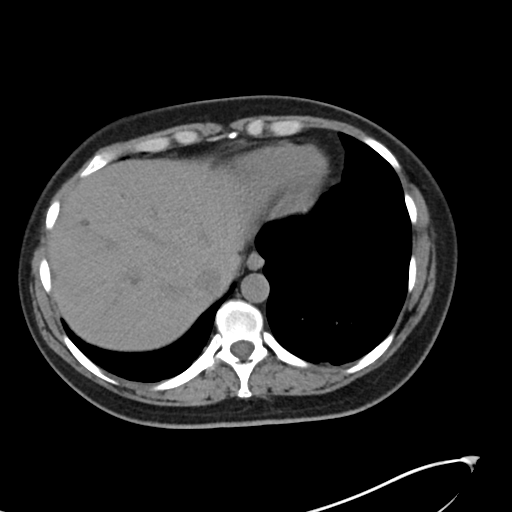
[im 89/94  soft-tissue]
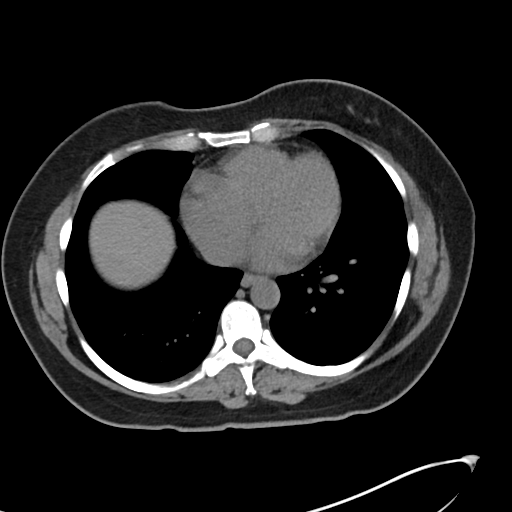

[Series 6: coronal soft tissue · coronal · 0.75mm/px · 3 of 111 slices shown]
[im 37/111  soft-tissue]
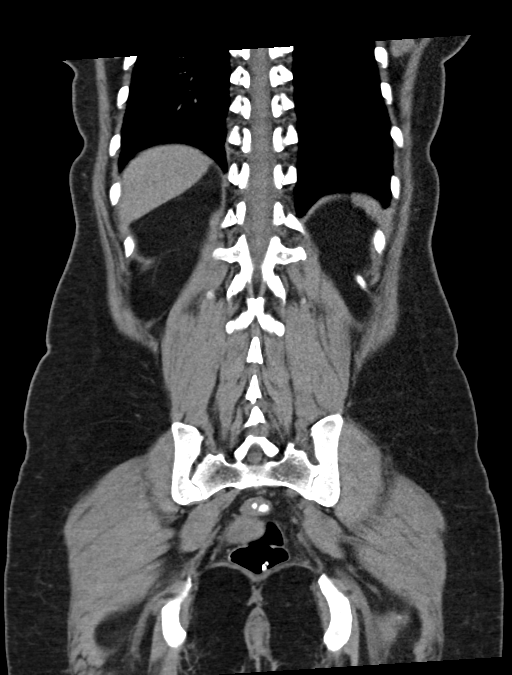
[im 49/111  soft-tissue]
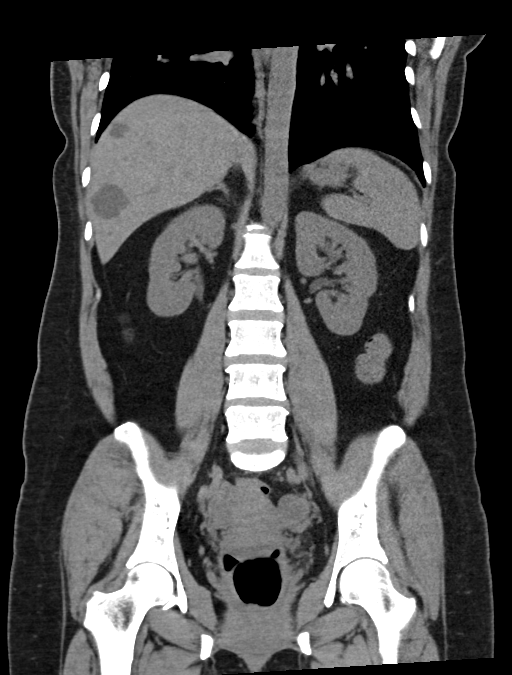
[im 62/111  soft-tissue]
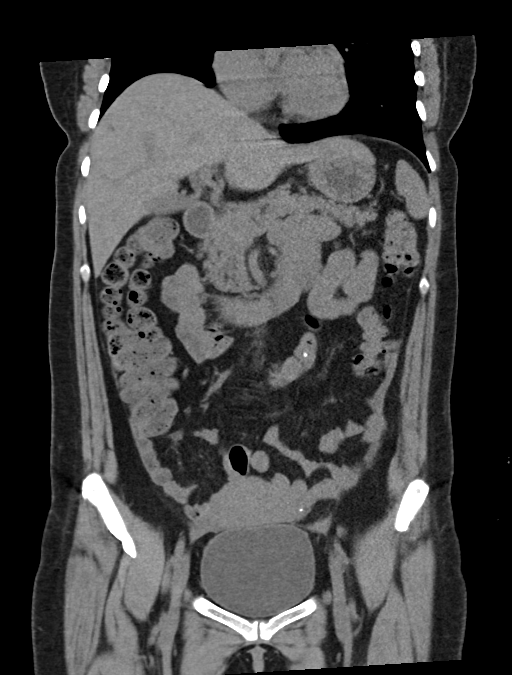

[15 of 46 positions shown; findings below may reference images not displayed]

FINDINGS: Lower chest: Mild elevation of the right hemidiaphragm, likely
normal variant. Minor atelectasis at the right lung base.

Hepatobiliary: Faint layering sludge or gallstones on coronal image
47, but no pericholecystic inflammation. The liver is remarkable for
numerous small and circumscribed low-density areas scattered
throughout the right lobe. The largest have simple fluid density,
including the 2.8 cm lesion in the right lobe on series 3, image 22.
These are most compatible with benign cysts.

Pancreas: Negative.

Spleen: Negative.  Incidental splenule, normal variant.

Adrenals/Urinary Tract: Normal adrenal glands.

No nephrolithiasis, hydronephrosis, or perinephric stranding.
Negative noncontrast right kidney and right ureter. Noncontrast left
kidney and left ureter likewise appear negative. Unremarkable
bladder. No urinary calculus identified.

Stomach/Bowel: Small volume hyperdense retained stool in the rectum
and mildly redundant sigmoid. Mild retained stool from the right
colon to the descending colon. No large bowel inflammation, with
diminutive, normal appendix suspected on coronal image 57.
Decompressed and negative terminal ileum. No dilated small bowel.
Largely decompressed stomach and duodenum. No free air or free
fluid. No mesenteric inflammation.

Vascular/Lymphatic: Normal caliber abdominal aorta. Minimal
calcified atherosclerosis. No lymphadenopathy.

Reproductive: Negative noncontrast appearance.

Other: No pelvic free fluid.

Musculoskeletal: Negative.
IMPRESSION: 1. No urinary calculus or obstructive uropathy.
2. Faint layering sludge or gallstones in the gallbladder, but no CT
evidence of acute cholecystitis.
3. No acute or inflammatory process identified in the non-contrast
abdomen or pelvis. Multiple benign hepatic cysts.

## 2022-07-20 NOTE — Progress Notes (Signed)
Heart and Vascular Care Navigation  07/20/2022  [place] 10-07-1975 [id number]  Reason for Referral:  Patient is participating in a Managed Medicaid Plan: No, self pay only   Engaged with patient by telephone for initial visit for Heart and Vascular Care Coordination.                                                                                                   Assessment:        LCSW was able to reach pt at [phone] with second interpreter [employer] [id number]. Introduced self, role, reason for call. Pt confirmed home address, is in process of establishing with [facility], lives with husband and two children.   Pt shares no concerns with obtaining or affording food, medications, housing or utilities. LCSW did note pt has in past applied for [employer]. We discussed how to re-apply with [facility] Financial Assistance and [facility] card. Pt shares the only time she was approved for Medicaid it was in [place] after a large surgery (sounds like emergency Medicaid).   Pt understands how to gather documents and is amenable to me sending these applications to her home address along with her upcoming  appt.   No additional questions at this time.                             HRT/VAS Care Coordination     Patients Home Cardiology Office [place] Team Social Worker   Social Worker Name: Westley Hummer, [place], [place]   Living arrangements for the past 2 months Single Family Home   Lives with: Minor Children; Spouse   Patient Current Insurance Coverage Self-Pay   Patient Has Concern With Paying Medical Bills Yes   Patient Concerns With Medical Bills ongoing medical care, multiple hospital visits   Medical Bill Referrals: [facility] Financial Assistance;  [employer] Card   Does Patient Have Prescription Coverage? No  pt states she can currently afford/obtain medications without issues       Social History:                                                                              [place]: No Food Insecurity (07/20/2022)  Housing: Low Risk  (07/20/2022)  Transportation Needs: No Transportation Needs (07/20/2022)  Utilities: Not At Risk (07/20/2022)  Financial Resource Strain: Medium Risk (07/20/2022)  Tobacco Use: Low Risk  (07/17/2022)    SDOH Interventions: Financial Resources:  Financial Strain Interventions: Other (Comment) ([facility] financial assistance; [employer] card)  Food Insecurity:  Food Insecurity Interventions: Intervention Not Indicated  Housing Insecurity:  Housing Interventions: Intervention Not Indicated  Transportation:   Transportation Interventions: Intervention Not Indicated    Other  Care Navigation Interventions:     Provided Pharmacy assistance resources  Pt denies any issues obtaining or affording medications at this time.    Follow-up plan:   LCSW has mailed the following: my card, [employer] , [employer]. I will f/u to ensure received, pt encouraged to call me back as needed before that time.

## 2022-07-30 ENCOUNTER — Telehealth: Payer: Self-pay | Admitting: Licensed Clinical Social Worker

## 2022-07-30 NOTE — Telephone Encounter (Signed)
H&V Care Navigation CSW Progress Note  Clinical Social Worker contacted patient by phone to f/u on assistance applications.  Pt was not available at [phone], left voicemail with assistance of [place], [place] [id number]. I will re-attempt pt next week.   Patient is participating in a Managed Medicaid Plan:  No, self pay only.   SDOH Screenings   Food Insecurity: No Food Insecurity (07/20/2022)  Housing: Low Risk  (07/20/2022)  Transportation Needs: No Transportation Needs (07/20/2022)  Utilities: Not At Risk (07/20/2022)  Financial Resource Strain: Medium Risk (07/20/2022)  Tobacco Use: Low Risk  (07/17/2022)   Westley Hummer, MSW, [place]  [phone]- work cell phone (preferred) [phone]- desk phone

## 2022-08-16 ENCOUNTER — Telehealth: Payer: Self-pay | Admitting: Licensed Clinical Social Worker

## 2022-08-17 NOTE — Telephone Encounter (Signed)
H&V Care Navigation CSW Progress Note  Clinical Social Worker contacted patient by phone to f/u on assistance applications. No answer at [phone], with assistance of Maryann [employer]ners [employer] #[phone] I left a voicemail requesting call back, offering any additional assistance.  Patient is participating in a Managed Medicaid Plan:  No, self pay only  SDOH Screenings   Food Insecurity: No Food Insecurity (07/20/2022)  Housing: Low Risk  (07/20/2022)  Transportation Needs: No Transportation Needs (07/20/2022)  Utilities: Not At Risk (07/20/2022)  Financial Resource Strain: Medium Risk (07/20/2022)  Tobacco Use: Low Risk  (07/17/2022)   Octavio Graves, MSW, LCSW Clinical Social Worker II [facility] Heart/Vascular Care Navigation  [phone]- work cell phone (preferred) [phone]- desk phone

## 2022-08-19 ENCOUNTER — Telehealth: Payer: Self-pay | Admitting: Licensed Clinical Social Worker

## 2022-08-19 NOTE — Telephone Encounter (Signed)
H&V Care Navigation CSW Progress Note  Clinical Social Worker contacted patient by phone to f/u on assistance applications. No answer with assistance of [employer] #[id number]. Left final voicemail, remain available should pt wish to engage in care navigation assistance in the future.  Patient is participating in a Managed Medicaid Plan:  No, self pay only.   SDOH Screenings   Food Insecurity: No Food Insecurity (07/20/2022)  Housing: Low Risk  (07/20/2022)  Transportation Needs: No Transportation Needs (07/20/2022)  Utilities: Not At Risk (07/20/2022)  Financial Resource Strain: Medium Risk (07/20/2022)  Tobacco Use: Low Risk  (07/17/2022)   Octavio Graves, MSW, LCSW Clinical Social Worker II [facility] Heart/Vascular Care Navigation  [phone]- work cell phone (preferred) [phone]- desk phone

## 2022-09-02 ENCOUNTER — Ambulatory Visit: Payer: Self-pay | Admitting: Nurse Practitioner

## 2023-05-26 ENCOUNTER — Other Ambulatory Visit: Payer: Self-pay

## 2023-05-26 ENCOUNTER — Emergency Department (HOSPITAL_COMMUNITY)
Admission: EM | Admit: 2023-05-26 | Discharge: 2023-05-26 | Disposition: A | Discharge status: Home / Self Care | Payer: No Typology Code available for payment source | Attending: Emergency Medicine | Admitting: Emergency Medicine

## 2023-05-26 ENCOUNTER — Emergency Department (HOSPITAL_COMMUNITY): Payer: No Typology Code available for payment source

## 2023-05-26 ENCOUNTER — Encounter (HOSPITAL_COMMUNITY): Payer: Self-pay | Admitting: Emergency Medicine

## 2023-05-26 DIAGNOSIS — Z7982 Long term (current) use of aspirin: Secondary | ICD-10-CM | POA: Diagnosis not present

## 2023-05-26 DIAGNOSIS — R079 Chest pain, unspecified: Secondary | ICD-10-CM | POA: Diagnosis not present

## 2023-05-26 DIAGNOSIS — M25562 Pain in left knee: Secondary | ICD-10-CM | POA: Insufficient documentation

## 2023-05-26 DIAGNOSIS — M25512 Pain in left shoulder: Secondary | ICD-10-CM | POA: Diagnosis not present

## 2023-05-26 DIAGNOSIS — M25522 Pain in left elbow: Secondary | ICD-10-CM | POA: Insufficient documentation

## 2023-05-26 DIAGNOSIS — M7918 Myalgia, other site: Secondary | ICD-10-CM

## 2023-05-26 DIAGNOSIS — Y92410 Unspecified street and highway as the place of occurrence of the external cause: Secondary | ICD-10-CM | POA: Insufficient documentation

## 2023-05-26 DIAGNOSIS — R519 Headache, unspecified: Secondary | ICD-10-CM | POA: Insufficient documentation

## 2023-05-26 MED ORDER — IBUPROFEN 400 MG PO TABS
400.0000 mg | ORAL_TABLET | Freq: Once | ORAL | Status: AC
  Administered 2023-05-26: 400 mg via ORAL
  Filled 2023-05-26: qty 1
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
## 2023-05-26 NOTE — ED Notes (Signed)
Discharge instructions reviewed with patient and daughter.   Results reviewed.   Opportunity for questions and concerns provided.   Alert, oriented and ambulatory. Displays no signs of distress.   Encouraged to take otc medications for pain and follow up with worsening symptoms.

## 2023-05-26 NOTE — ED Notes (Signed)
EDP at bedside  
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
## 2023-07-25 ENCOUNTER — Encounter: Payer: Self-pay | Admitting: Family Medicine

## 2023-07-25 ENCOUNTER — Ambulatory Visit (INDEPENDENT_AMBULATORY_CARE_PROVIDER_SITE_OTHER): Payer: Self-pay | Admitting: Family Medicine

## 2023-07-25 VITALS — BP 120/72 | HR 61 | Temp 98.0°F | Ht 59.5 in | Wt 139.4 lb

## 2023-07-25 DIAGNOSIS — R936 Abnormal findings on diagnostic imaging of limbs: Secondary | ICD-10-CM

## 2023-07-25 DIAGNOSIS — E782 Mixed hyperlipidemia: Secondary | ICD-10-CM

## 2023-07-25 DIAGNOSIS — Z0000 Encounter for general adult medical examination without abnormal findings: Secondary | ICD-10-CM

## 2023-07-25 DIAGNOSIS — M25562 Pain in left knee: Secondary | ICD-10-CM

## 2023-07-25 MED ORDER — KETOROLAC TROMETHAMINE 30 MG/ML IJ SOLN: 30.0000 mg | Freq: Once | INTRAMUSCULAR | Status: DC

## 2023-07-25 NOTE — Patient Instructions (Addendum)
 Busque records si tiene en casa sobre cirugias en [place], caul hospital fue [facility].  De pronto la remitiremos a ortopeda si no Engineer, materials records.  Gusto verla hoy  Voy a buscar informacion sobre beca para mamograma/ papaincolau.
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
## 2023-07-28 ENCOUNTER — Encounter: Payer: Self-pay | Admitting: Family Medicine

## 2023-07-28 DIAGNOSIS — R936 Abnormal findings on diagnostic imaging of limbs: Secondary | ICD-10-CM | POA: Insufficient documentation

## 2023-07-28 DIAGNOSIS — Z0000 Encounter for general adult medical examination without abnormal findings: Secondary | ICD-10-CM | POA: Insufficient documentation

## 2023-07-28 DIAGNOSIS — R7401 Elevation of levels of liver transaminase levels: Secondary | ICD-10-CM | POA: Insufficient documentation

## 2023-07-28 DIAGNOSIS — M25562 Pain in left knee: Secondary | ICD-10-CM | POA: Insufficient documentation
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
## 2023-07-28 NOTE — Assessment & Plan Note (Signed)
 H/o this. Check FLP next fasting labs.

## 2023-07-28 NOTE — Assessment & Plan Note (Addendum)
 Overdue for CPE , pap, mammo.  Will ask my front office to provide info on mammogram scholarship/free cervical cancer screening program for patient w/o insurance.

## 2023-07-28 NOTE — Assessment & Plan Note (Addendum)
 Incidental finding on knee xray at ER - 1.5 x 2.5 x 10.1 cm predominantly sclerotic lesion in the central medullary of the distal femoral diaphysis ?non-malignant enchondroma - radiology recommended ortho f/u.  She notes longstanding h/o L humeral issues - ?h/o osteomyelitis s/p surgeries x3 to this bone ~2015 as well as remote L knee surgery as a child for ?infection.  2015 workup done in a hospital in [place]. She is unsure if she ever had bone biopsy. I have asked her to see if she has any records at home or can find name of hospital to request records. Discussed if unable to find any records of prior evaluation, will need to return to ortho/may need MRI for further evaluation.

## 2023-08-18 ENCOUNTER — Telehealth: Payer: Self-pay | Admitting: Family Medicine

## 2023-08-18 DIAGNOSIS — R936 Abnormal findings on diagnostic imaging of limbs: Secondary | ICD-10-CM

## 2023-08-18 DIAGNOSIS — M25562 Pain in left knee: Secondary | ICD-10-CM

## 2023-08-18 DIAGNOSIS — E782 Mixed hyperlipidemia: Secondary | ICD-10-CM

## 2023-08-18 NOTE — Telephone Encounter (Signed)
 Spoke with patient.  Ongoing left leg ache.  She was unable to find records from prior hospitalization/knee surgery or name of hospital in [place]. Will proceed with ortho referral.   She also requests updated labwork - ordered, she will go to [facility].   Will check into mammo scholarship/free pap smear for uninsured patient.

## 2023-08-19 ENCOUNTER — Other Ambulatory Visit: Payer: Self-pay

## 2023-08-19 DIAGNOSIS — E782 Mixed hyperlipidemia: Secondary | ICD-10-CM

## 2023-08-19 DIAGNOSIS — R936 Abnormal findings on diagnostic imaging of limbs: Secondary | ICD-10-CM

## 2023-08-19 LAB — CBC WITH DIFFERENTIAL/PLATELET
Basophils Absolute: 0.1 10*3/uL (ref 0.0–0.1)
Basophils Relative: 1 % (ref 0.0–3.0)
Eosinophils Absolute: 0.1 10*3/uL (ref 0.0–0.7)
Eosinophils Relative: 2.3 % (ref 0.0–5.0)
HCT: 39.1 % (ref 36.0–46.0)
Hemoglobin: 13.2 g/dL (ref 12.0–15.0)
Lymphocytes Relative: 37.2 % (ref 12.0–46.0)
Lymphs Abs: 2.3 10*3/uL (ref 0.7–4.0)
MCHC: 33.8 g/dL (ref 30.0–36.0)
MCV: 87.6 fl (ref 78.0–100.0)
Monocytes Absolute: 0.4 10*3/uL (ref 0.1–1.0)
Monocytes Relative: 6.7 % (ref 3.0–12.0)
Neutro Abs: 3.2 10*3/uL (ref 1.4–7.7)
Neutrophils Relative %: 52.8 % (ref 43.0–77.0)
Platelets: 235 10*3/uL (ref 150.0–400.0)
RBC: 4.46 Mil/uL (ref 3.87–5.11)
RDW: 13.7 % (ref 11.5–15.5)
WBC: 6.1 10*3/uL (ref 4.0–10.5)

## 2023-08-19 LAB — COMPREHENSIVE METABOLIC PANEL WITH GFR
ALT: 16 U/L (ref 0–35)
AST: 18 U/L (ref 0–37)
Albumin: 4.6 g/dL (ref 3.5–5.2)
Alkaline Phosphatase: 53 U/L (ref 39–117)
BUN: 15 mg/dL (ref 6–23)
CO2: 27 meq/L (ref 19–32)
Calcium: 9 mg/dL (ref 8.4–10.5)
Chloride: 104 meq/L (ref 96–112)
Creatinine, Ser: 0.74 mg/dL (ref 0.40–1.20)
GFR: 96.81 mL/min (ref >60.00)
Glucose, Bld: 109 mg/dL — ABNORMAL HIGH (ref 70–99)
Potassium: 4.1 meq/L (ref 3.5–5.1)
Sodium: 138 meq/L (ref 135–145)
Total Bilirubin: 0.6 mg/dL (ref 0.2–1.2)
Total Protein: 7.2 g/dL (ref 6.0–8.3)

## 2023-08-19 LAB — LIPID PANEL
Cholesterol: 230 mg/dL — ABNORMAL HIGH (ref 0–200)
HDL: 51.7 mg/dL (ref >39.00)
LDL Cholesterol: 159 mg/dL — ABNORMAL HIGH (ref 0–99)
NonHDL: 178.57
Total CHOL/HDL Ratio: 4
Triglycerides: 98 mg/dL (ref 0.0–149.0)
VLDL: 19.6 mg/dL (ref 0.0–40.0)

## 2023-08-19 LAB — SEDIMENTATION RATE: Sed Rate: 4 mm/h (ref 0–20)

## 2023-08-19 LAB — VITAMIN D 25 HYDROXY (VIT D DEFICIENCY, FRACTURES): VITD: 21.91 ng/mL — ABNORMAL LOW (ref 30.00–100.00)

## 2023-08-22 ENCOUNTER — Other Ambulatory Visit: Payer: Self-pay | Admitting: Family Medicine

## 2023-08-22 ENCOUNTER — Encounter: Payer: Self-pay | Admitting: Family Medicine

## 2023-08-22 DIAGNOSIS — E559 Vitamin D deficiency, unspecified: Secondary | ICD-10-CM | POA: Insufficient documentation

## 2023-08-22 MED ORDER — VITAMIN D3 25 MCG (1000 UT) PO CAPS: 1.0000 | ORAL_CAPSULE | Freq: Every day | ORAL | Status: AC

## 2023-08-24 ENCOUNTER — Telehealth: Payer: Self-pay

## 2023-08-24 NOTE — Telephone Encounter (Signed)
 Telephoned patient with interpreter#[phone]. Patient will call back with spouse's income. B[facility]CP

## 2023-08-25 ENCOUNTER — Other Ambulatory Visit: Payer: Self-pay | Admitting: Obstetrics and Gynecology

## 2023-08-25 DIAGNOSIS — Z1231 Encounter for screening mammogram for malignant neoplasm of breast: Secondary | ICD-10-CM

## 2023-11-18 ENCOUNTER — Encounter: Payer: Self-pay | Admitting: Orthopaedic Surgery

## 2023-11-18 ENCOUNTER — Ambulatory Visit (INDEPENDENT_AMBULATORY_CARE_PROVIDER_SITE_OTHER): Payer: Self-pay | Admitting: Orthopaedic Surgery

## 2023-11-18 DIAGNOSIS — M25562 Pain in left knee: Secondary | ICD-10-CM
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
## 2023-12-01 ENCOUNTER — Ambulatory Visit: Payer: Self-pay

## 2023-12-01 ENCOUNTER — Inpatient Hospital Stay: Admission: RE | Admit: 2023-12-01 | Payer: Self-pay | Source: Ambulatory Visit

## 2024-01-24 ENCOUNTER — Ambulatory Visit: Payer: Self-pay | Admitting: *Deleted

## 2024-01-24 ENCOUNTER — Ambulatory Visit
Admission: RE | Admit: 2024-01-24 | Discharge: 2024-01-24 | Disposition: A | Discharge status: Home / Self Care | Payer: Self-pay | Source: Ambulatory Visit | Attending: Obstetrics and Gynecology | Admitting: Obstetrics and Gynecology

## 2024-01-24 VITALS — BP 138/98 | Wt 144.9 lb

## 2024-01-24 DIAGNOSIS — Z1211 Encounter for screening for malignant neoplasm of colon: Secondary | ICD-10-CM

## 2024-01-24 DIAGNOSIS — Z01419 Encounter for gynecological examination (general) (routine) without abnormal findings: Secondary | ICD-10-CM

## 2024-01-24 DIAGNOSIS — Z1231 Encounter for screening mammogram for malignant neoplasm of breast: Secondary | ICD-10-CM

## 2024-01-24 NOTE — Patient Instructions (Addendum)
 Explained breast self awareness with Angela Bennett. Pap smear completed today. Let her know [facility] will cover Pap smears and HPV typing every 5 years unless has a history of abnormal Pap smears. Referred patient to the [facility] for a screening mammogram on mobile unit. Appointment scheduled Tuesday, January 24, 2024 at 1430. Patient aware of appointment and will be there. Let patient know will follow up with her within the next couple weeks with results of Pap smear and wet prep by phone. Informed patient that the Breast Center will follow up with her within the next couple of weeks with results of her mammogram by letter or phone. Angela Bennett verbalized understanding.  Sera Hitsman, Wanda Ship, RN 11:19 AM

## 2024-01-24 NOTE — Progress Notes (Signed)
 [name] is a 48 y.o. No obstetric history on file. female who presents to [facility] clinic today with complaint of left breast pain that occurs once a month to every other month.    Pap Smear: Pap smear completed today. Last Pap smear was 3-4 years ago at [facility] clinic and was normal per patient. Per patient has no history of an abnormal Pap smear. Last Pap smear result is not available in Epic.   Physical exam: Breasts Breasts symmetrical. No skin abnormalities bilateral breasts. No nipple retraction bilateral breasts. No nipple discharge bilateral breasts. No lymphadenopathy. No lumps palpated bilateral breasts. No complaints of pain or tenderness on exam.      Pelvic/Bimanual Ext Genitalia No lesions, no swelling and no discharge observed on external genitalia.        Vagina Vagina pink and normal texture. No lesions and thick white to yellowish colored discharge observed in vagina. Wet prep completed.        Cervix Cervix is present. Cervix pink and of normal texture. Scant amount of thick white to yellowish discharge observed on cervix. Blood observed at cervical os consistent with the start of patients menstrual period.   Uterus Uterus is present and palpable. Uterus in normal position and normal size.        Adnexae Bilateral ovaries present and palpable. No tenderness on palpation.         Rectovaginal No rectal exam completed today since patient had no rectal complaints. No skin abnormalities observed on exam.     Smoking History: Patient has never smoked.   Patient Navigation: Patient education provided. Access to services provided for patient through [facility] program. Spanish interpreter Bernice Angry from [facility] provided.   Colorectal Cancer Screening: Per patient has never had colonoscopy completed. FIT test given to patient to complete. No complaints today.    Breast and Cervical Cancer Risk Assessment: Patient does not have family history of breast  cancer, known genetic mutations, or radiation treatment to the chest before age 41. Patient does not have history of cervical dysplasia, immunocompromised, or DES exposure in-utero.  Risk Scores as of Encounter on 01/24/2024     Angela Bennett           5-year 0.63%   Lifetime 6.82%   This patient is [place] but has no documented birth country, so the [place] model used data from [facility] patients to calculate their risk score. Document a birth country in the Demographics activity for a more accurate score.         Last calculated by Driscilla Wanda SQUIBB, RN on 01/24/2024 at 11:56 AM       A: [facility] exam with pap smear No complaints.  P: Referred patient to the [facility] for a screening mammogram on mobile unit. Appointment scheduled Tuesday, January 24, 2024 at 1430.  Driscilla Wanda SQUIBB, RN 01/24/2024 11:19 AM

## 2024-01-25 LAB — CERVICOVAGINAL ANCILLARY ONLY
Bacterial Vaginitis (gardnerella): NEGATIVE
Candida Glabrata: NEGATIVE
Candida Vaginitis: NEGATIVE
Comment: NEGATIVE
Comment: NEGATIVE
Comment: NEGATIVE
Comment: NEGATIVE
Trichomonas: NEGATIVE

## 2024-01-25 LAB — CYTOLOGY - PAP
Comment: NEGATIVE
Diagnosis: NEGATIVE
High risk HPV: NEGATIVE

## 2024-01-26 ENCOUNTER — Ambulatory Visit: Payer: Self-pay

## 2024-01-26 NOTE — Telephone Encounter (Signed)
 Called patient via [employer] [phone] to give pap smear results. Informed patient that pap smear was normal and HPV was negative. Based on this result her next pap smear will be due in 5 years. Patient voiced understanding.  Informed patient that her wet prep was negative. Patient voiced understanding.
